# Patient Record
Sex: Female | Born: 1985 | Race: White | Hispanic: No | Marital: Single | Smoking: Current every day smoker
Health system: Southern US, Community
[De-identification: ages and names within clinical notes are randomized; demographics above are authoritative.]

## PROBLEM LIST (undated history)

## (undated) DIAGNOSIS — Z789 Other specified health status: Secondary | ICD-10-CM

## (undated) HISTORY — PX: ANKLE SURGERY: SHX546

---

## 2010-05-25 ENCOUNTER — Other Ambulatory Visit (HOSPITAL_COMMUNITY): Payer: Self-pay | Admitting: Neurosurgery

## 2010-05-25 DIAGNOSIS — M545 Low back pain, unspecified: Secondary | ICD-10-CM

## 2010-05-30 ENCOUNTER — Ambulatory Visit (HOSPITAL_COMMUNITY)
Admission: RE | Admit: 2010-05-30 | Discharge: 2010-05-30 | Disposition: A | Discharge status: Home / Self Care | Payer: Self-pay | Source: Ambulatory Visit | Attending: Neurosurgery | Admitting: Neurosurgery

## 2010-05-30 DIAGNOSIS — M545 Low back pain, unspecified: Secondary | ICD-10-CM | POA: Insufficient documentation

## 2010-06-15 ENCOUNTER — Ambulatory Visit: Payer: Self-pay | Admitting: Obstetrics and Gynecology

## 2010-06-15 ENCOUNTER — Ambulatory Visit: Payer: Self-pay | Admitting: Family Medicine

## 2010-08-05 ENCOUNTER — Encounter: Payer: Self-pay | Admitting: Family Medicine

## 2013-04-30 ENCOUNTER — Encounter: Payer: Self-pay | Admitting: *Deleted

## 2013-06-06 ENCOUNTER — Encounter: Payer: Self-pay | Admitting: Obstetrics & Gynecology

## 2013-06-06 ENCOUNTER — Encounter: Payer: Self-pay | Admitting: *Deleted

## 2013-06-06 ENCOUNTER — Telehealth: Payer: Self-pay | Admitting: *Deleted

## 2013-06-06 NOTE — Telephone Encounter (Signed)
Attempted to contact [place] concerning missed appointment, message states mailbox is full and cannot accept any messages.  Certified letter to be sent.

## 2013-07-14 ENCOUNTER — Encounter: Payer: Self-pay | Admitting: General Practice

## 2013-07-25 ENCOUNTER — Encounter: Payer: Self-pay | Admitting: General Practice

## 2013-12-02 ENCOUNTER — Other Ambulatory Visit (HOSPITAL_COMMUNITY): Payer: Self-pay | Admitting: Obstetrics and Gynecology

## 2013-12-02 DIAGNOSIS — O281 Abnormal biochemical finding on antenatal screening of mother: Secondary | ICD-10-CM

## 2013-12-10 ENCOUNTER — Ambulatory Visit (HOSPITAL_COMMUNITY): Payer: Medicaid Other

## 2013-12-10 ENCOUNTER — Ambulatory Visit (HOSPITAL_COMMUNITY): Admission: RE | Admit: 2013-12-10 | Payer: Medicaid Other | Source: Ambulatory Visit

## 2013-12-17 ENCOUNTER — Ambulatory Visit (HOSPITAL_COMMUNITY)
Admission: RE | Admit: 2013-12-17 | Discharge: 2013-12-17 | Disposition: A | Discharge status: Home / Self Care | Payer: Medicaid Other | Source: Ambulatory Visit | Attending: Obstetrics and Gynecology | Admitting: Obstetrics and Gynecology

## 2013-12-17 ENCOUNTER — Encounter (HOSPITAL_COMMUNITY): Payer: Self-pay

## 2013-12-17 ENCOUNTER — Other Ambulatory Visit (HOSPITAL_COMMUNITY): Payer: Self-pay | Admitting: Obstetrics and Gynecology

## 2013-12-17 VITALS — BP 127/77 | HR 94 | Wt 163.0 lb

## 2013-12-17 DIAGNOSIS — O281 Abnormal biochemical finding on antenatal screening of mother: Secondary | ICD-10-CM

## 2013-12-17 DIAGNOSIS — O351XX0 Maternal care for (suspected) chromosomal abnormality in fetus, not applicable or unspecified: Secondary | ICD-10-CM | POA: Insufficient documentation

## 2013-12-17 DIAGNOSIS — Z3A21 21 weeks gestation of pregnancy: Secondary | ICD-10-CM

## 2013-12-17 DIAGNOSIS — Z3A00 Weeks of gestation of pregnancy not specified: Secondary | ICD-10-CM | POA: Diagnosis not present

## 2013-12-17 DIAGNOSIS — O3510X0 Maternal care for (suspected) chromosomal abnormality in fetus, unspecified, not applicable or unspecified: Secondary | ICD-10-CM | POA: Insufficient documentation

## 2013-12-17 HISTORY — DX: Other specified health status: Z78.9

## 2013-12-17 NOTE — Progress Notes (Signed)
  Genetic Counseling  DOB: 14-Jan-1986 Referring Provider: Hassell DoneGaccione, Craig, MD Appointment Date: 12/17/2013 Attending: Dr. Alpha GulaPaul Whitecar  Ms. Julie Castro and her partner, Mr. Julie Castro, were seen for genetic counseling because of an increased risk for fetal trisomy 18 based on a maternal serum Quad screen.  They were counseled regarding the Quad screen result and the associated 1 in 97 risk for fetal trisomy 18.  We reviewed chromosomes, nondisjunction, and the common features and poor prognosis of trisomy 7018.  In addition, we reviewed the screen adjusted reduction in risks for Down syndrome and ONTDs.  We also discussed other explanations for a screen positive result including: differences in maternal metabolism and normal variation. They understand that this screening is not diagnostic for trisomy 3018 but provides a risk assessment.  We reviewed available screening options including noninvasive prenatal screening (NIPS)/cell free DNA (cfDNA) testing, and detailed ultrasound.  They were counseled that screening tests are used to modify a patient's a priori risk for aneuploidy, typically based on age. This estimate provides a pregnancy specific risk assessment. We reviewed the benefits and limitations of each option. Specifically, we discussed the conditions for which each test screens, the detection rates, and false positive rates of each. They were also counseled regarding diagnostic testing via amniocentesis. We reviewed the approximate 1 in 300-500 risk for complications for amniocentesis, including spontaneous pregnancy loss.   After consideration of all the options, they elected to proceed with NIPS.  Those results will be available in 8-10 days.  The patient also expressed interest in having a detailed ultrasound.  A complete ultrasound was performed today. The ultrasound report will be sent under separate cover. There were no visualized fetal anomalies or markers suggestive of  aneuploidy. Diagnostic testing was declined today.  They understand that screening tests cannot rule out all birth defects or genetic syndromes.   Both family histories were reviewed and found to be noncontributory. Without further information regarding the provided family history, an accurate genetic risk cannot be calculated. Further genetic counseling is warranted if more information is obtained.  Ms. Julie Castro denied exposure to environmental toxins or chemical agents. She denied the use of alcohol, tobacco or street drugs. She denied significant viral illnesses during the course of her pregnancy. Her medical and surgical histories were noncontributory.   I counseled this couple for approximately 45 minutes regarding the above risks and available options.   Julie Gemmaaragh Lillyanna Glandon, MS,  Certified Genetic Counselor

## 2013-12-18 ENCOUNTER — Other Ambulatory Visit (HOSPITAL_COMMUNITY): Payer: Self-pay | Admitting: Obstetrics and Gynecology

## 2013-12-18 DIAGNOSIS — O281 Abnormal biochemical finding on antenatal screening of mother: Secondary | ICD-10-CM

## 2013-12-24 ENCOUNTER — Telehealth (HOSPITAL_COMMUNITY): Payer: Self-pay

## 2013-12-24 NOTE — Telephone Encounter (Signed)
Ms. Julie Castro called to discuss her cell free fetal DNA test results.  Ms. Julie Castro had Panorama testing through [facility].  The patient was identified by name and DOB.  We reviewed that these are within normal limits, showing a less than 1 in 10,000 risk for trisomies 21, 18 and 13, and monosomy X (Turner syndrome).  In addition, the risk for triploidy/vanishing twin and sex chromosome trisomies (47,XXX and 47,XXY) was also low risk.  We reviewed that this testing identifies > 99% of pregnancies with trisomy 4921, trisomy 2313, sex chromosome trisomies (47,XXX and 47,XXY), and triploidy. The detection rate for trisomy 18 is 96%.  The detection rate for monosomy X is ~92%.  The false positive rate is <0.1% for all conditions. Testing was also consistent with female fetal sex.  The patient did wish to know fetal sex.  She understands that this testing does not identify all genetic conditions.  All questions were answered to her satisfaction, she was encouraged to call with additional questions or concerns.  Despina AriasSTANLEY, Ronnika Collett, MS Certified Genetic Counselor

## 2013-12-29 ENCOUNTER — Encounter (HOSPITAL_COMMUNITY): Payer: Self-pay

## 2014-01-02 ENCOUNTER — Other Ambulatory Visit (HOSPITAL_COMMUNITY): Payer: Self-pay

## 2014-10-22 ENCOUNTER — Encounter (HOSPITAL_COMMUNITY): Payer: Self-pay | Admitting: *Deleted

## 2015-05-17 IMAGING — US US OB DETAIL+14 WK
1 series · 12 of 28 positions shown · non-contrast
Comparison: none

[Series 1: us ob detail+14 wk · 0.22mm/px · 12 of 82 slices shown]
[im 4/82]
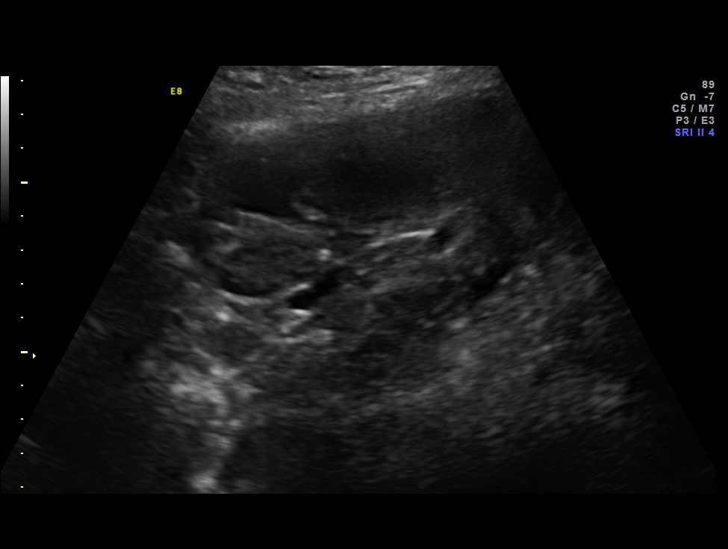
[im 10/82]
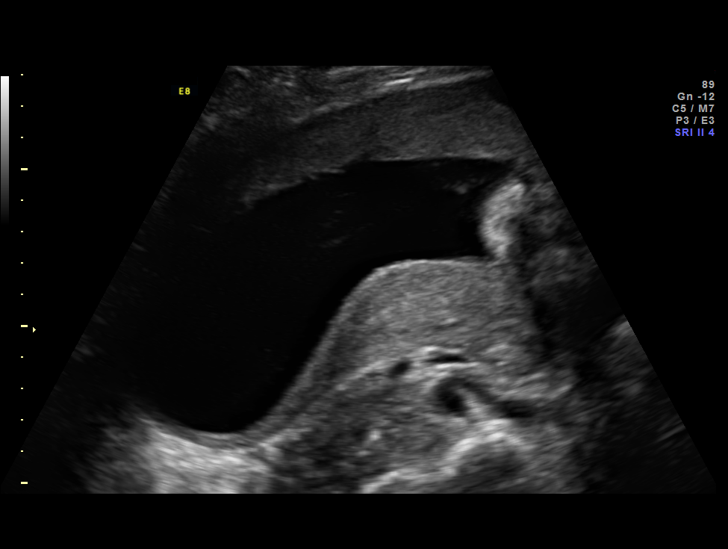
[im 16/82]
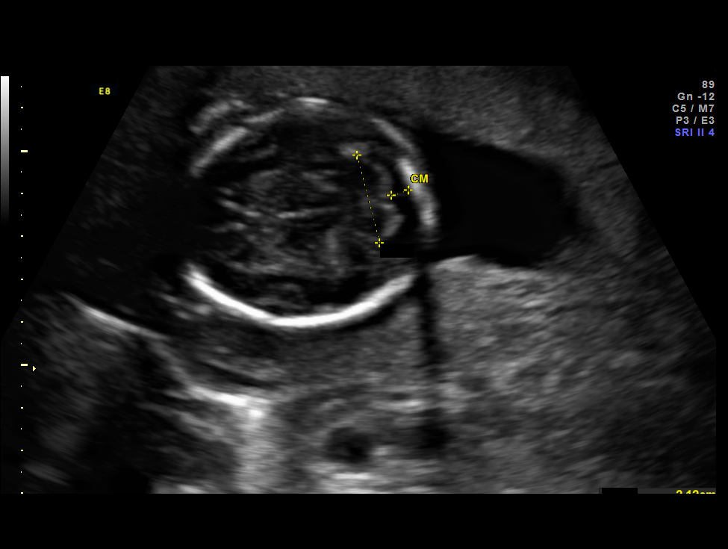
[im 25/82]
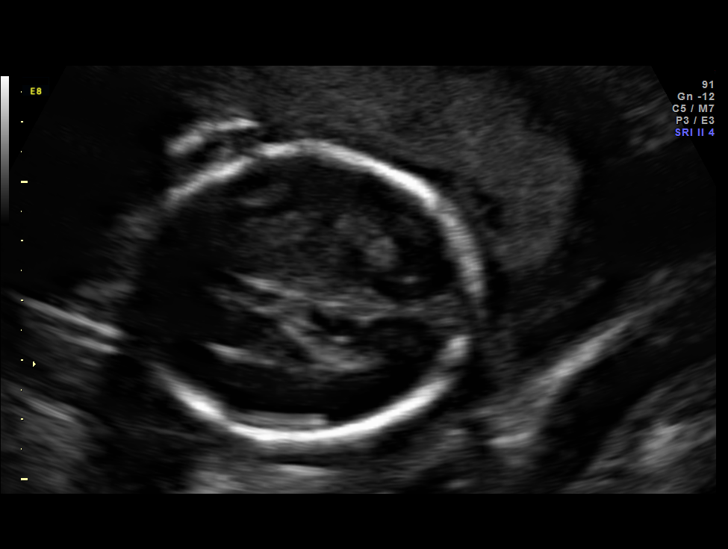
[im 31/82]
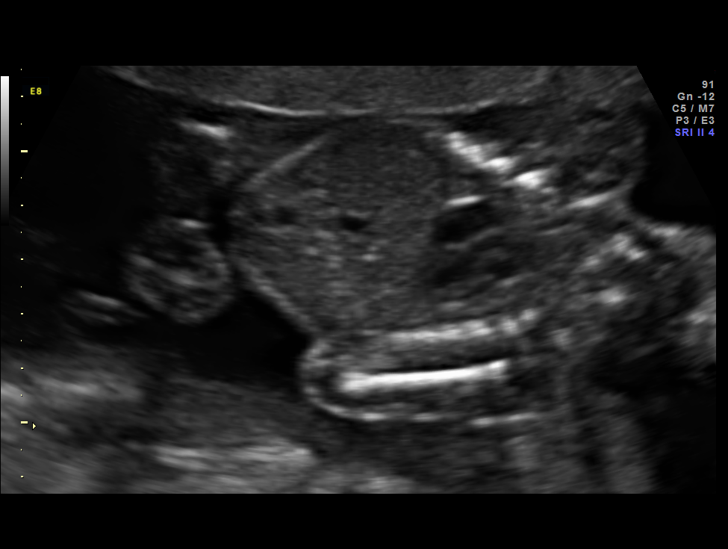
[im 37/82]
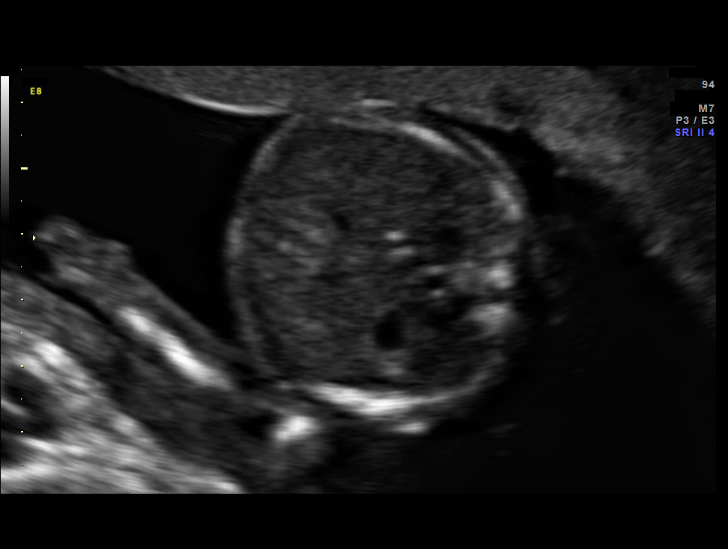
[im 46/82]
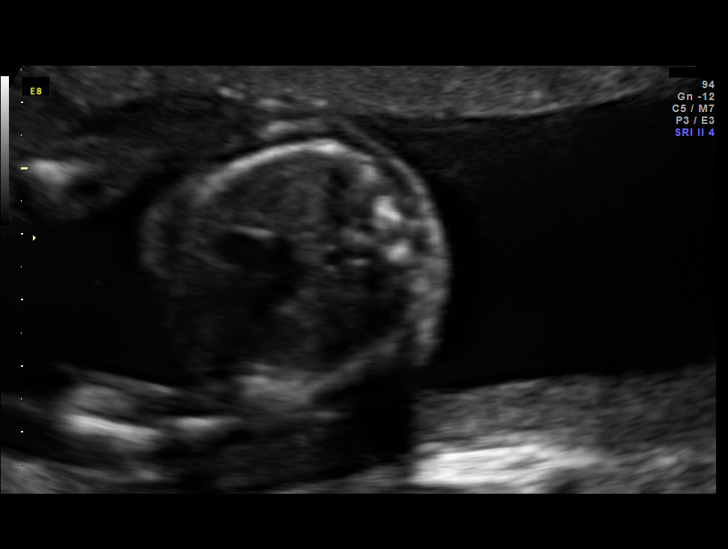
[im 52/82]
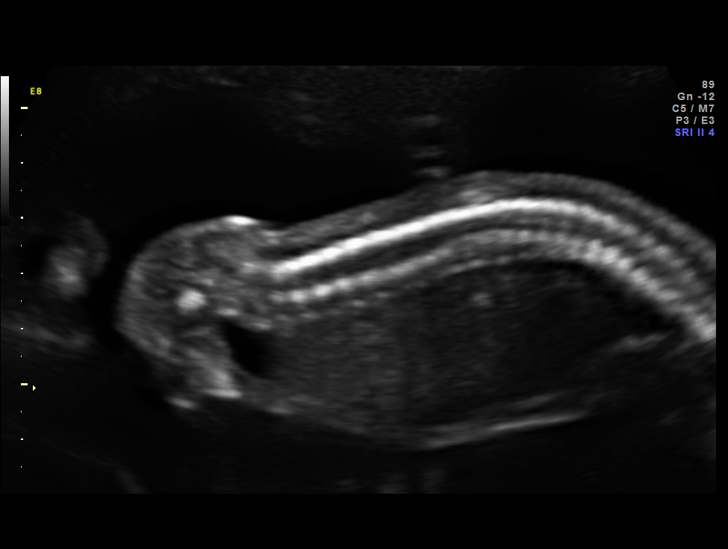
[im 58/82]
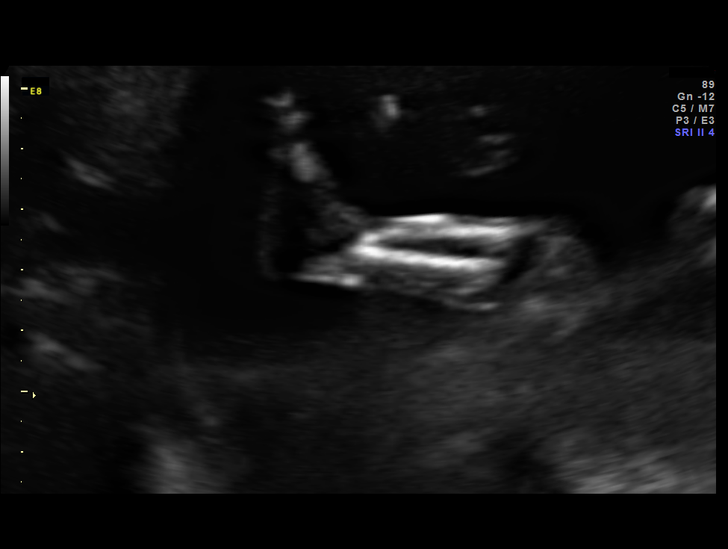
[im 67/82]
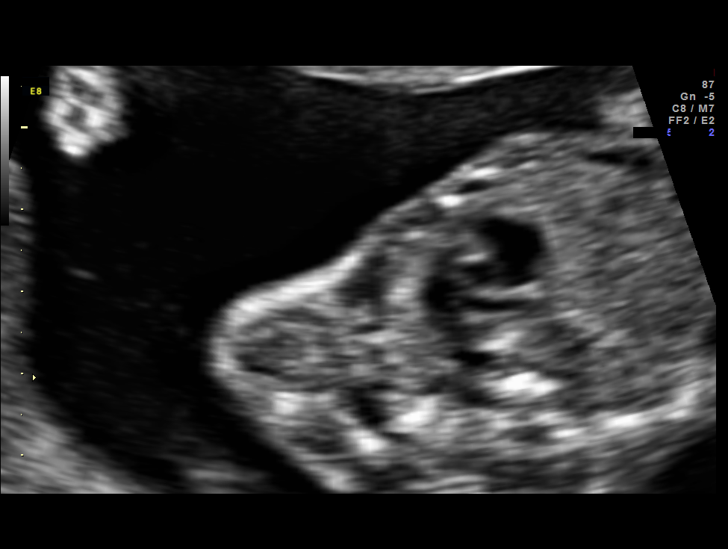
[im 73/82]
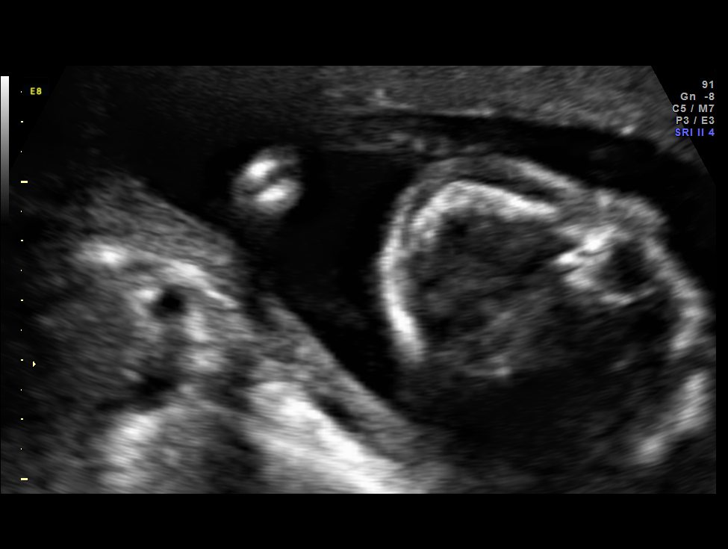
[im 79/82]
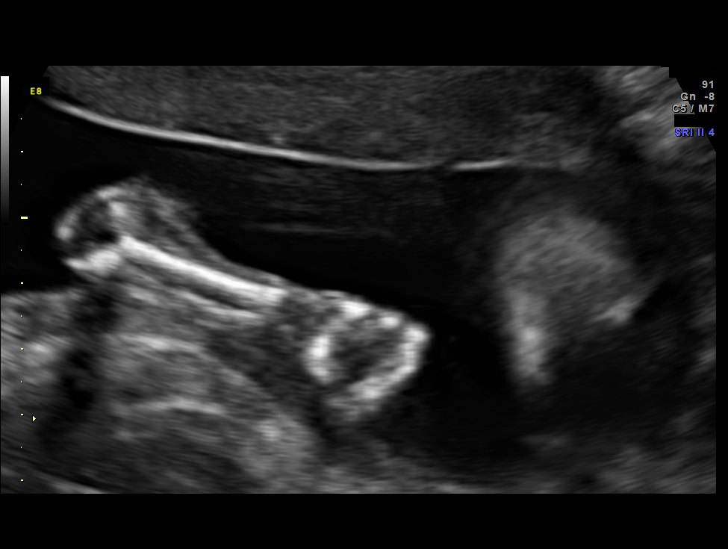

[12 of 28 positions shown; findings below may reference images not displayed]

OBSTETRICS REPORT
                    (Corrected Final 12/18/2013 [DATE])

Service(s) Provided

 US OB DETAIL + 14 WK                                  76811.0
Indications

 Detailed fetal anatomic survey                        Z36
 Suboxone
 Abnormal biochemical screen (quad) for Trisomy
 18 [DATE]
 21 weeks gestation of pregnancy
Fetal Evaluation

 Num Of Fetuses:    1
 Fetal Heart Rate:  157                          bpm
 Cardiac Activity:  Observed
 Presentation:      Cephalic
 Placenta:          Anterior, above cervical os
 P. Cord            Visualized
 Insertion:

 Amniotic Fluid
 AFI FV:      Subjectively within normal limits
                                             Larg Pckt:     6.6  cm
Biometry

 BPD:     49.7  mm     G. Age:  21w 0d                CI:         79.6   70 - 86
 OFD:     62.4  mm                                    FL/HC:      17.2   15.9 -

 HC:     179.4  mm     G. Age:  20w 2d       13  %    HC/AC:      1.14   1.06 -

 AC:     156.8  mm     G. Age:  20w 6d       33  %    FL/BPD:
 FL:      30.9  mm     G. Age:  19w 4d        5  %    FL/AC:      19.7   20 - 24
 HUM:     30.1  mm     G. Age:  20w 0d       14  %
 CER:     21.2  mm     G. Age:  20w 1d       27  %

 Est. FW:     345  gm    0 lb 12 oz      26  %
Gestational Age

 LMP:           21w 1d        Date:  07/22/13                 EDD:   04/28/14
 U/S Today:     20w 3d                                        EDD:   05/03/14
 Best:          21w 1d     Det. By:  LMP  (07/22/13)          EDD:   04/28/14
Anatomy

 Cranium:          Appears normal         Aortic Arch:      Appears normal
 Fetal Cavum:      Appears normal         Ductal Arch:      Appears normal
 Ventricles:       Appears normal         Diaphragm:        Appears normal
 Choroid Plexus:   Appears normal         Stomach:          Appears normal, left
                                                            sided
 Cerebellum:       Appears normal         Abdomen:          Appears normal
 Posterior Fossa:  Appears normal         Abdominal Wall:   Appears nml (cord
                                                            insert, abd wall)
 Nuchal Fold:      Not applicable (>20    Cord Vessels:     Appears normal (3
                   wks GA)                                  vessel cord)
 Face:             Appears normal         Kidneys:          Appear normal
                   (orbits and profile)
 Lips:             Appears normal         Bladder:          Appears normal
 Palate:           Appears normal         Spine:            Appears normal
 Heart:            Appears normal         Lower             Appears normal
                   (4CH, axis, and        Extremities:
                   situs)
 RVOT:             Appears normal         Upper             Appears normal
                                          Extremities:
 LVOT:             Appears normal

 Other:  Fetus appears to be a male. Heels appears normal.
Targeted Anatomy

 Fetal Central Nervous System
 Cisterna Magna:
Cervix Uterus Adnexa

 Cervical Length:    4.1      cm

 Cervix:       Normal appearance by transabdominal scan. Appears
               closed, without funnelling.

 Left Ovary:    Not visualized. No adnexal mass visualized.
 Right Ovary:   Within normal limits.
Impression

 Single IUP at 21w 1d
 Quad screen positive for Trisomy 18 risk ([DATE])
 Normal fetal anatomic survey
 Open hands visualized
 No markers associated with aneuploidy
 Anterior placenta without previa
 Normal amniotic fluid volume
Recommendations

 See separate note from Genetics counselor
 Follow-up ultrasounds as clinically indicated.

                 Attending Physician, GILMAN
